# Patient Record
Sex: Male | Born: 1990
Health system: Southern US, Community
[De-identification: ages and names within clinical notes are randomized; demographics above are authoritative.]

---

## 2013-01-11 ENCOUNTER — Emergency Department (INDEPENDENT_AMBULATORY_CARE_PROVIDER_SITE_OTHER)
Admission: EM | Admit: 2013-01-11 | Discharge: 2013-01-11 | Disposition: A | Discharge status: Home / Self Care | Payer: Managed Care, Other (non HMO) | Source: Home / Self Care | Attending: Emergency Medicine | Admitting: Emergency Medicine

## 2013-01-11 ENCOUNTER — Encounter (HOSPITAL_COMMUNITY): Payer: Self-pay | Admitting: Emergency Medicine

## 2013-01-11 DIAGNOSIS — S61219A Laceration without foreign body of unspecified finger without damage to nail, initial encounter: Secondary | ICD-10-CM

## 2013-01-11 DIAGNOSIS — Z23 Encounter for immunization: Secondary | ICD-10-CM

## 2013-01-11 DIAGNOSIS — S61209A Unspecified open wound of unspecified finger without damage to nail, initial encounter: Secondary | ICD-10-CM

## 2013-01-11 MED ORDER — TETANUS-DIPHTH-ACELL PERTUSSIS 5-2.5-18.5 LF-MCG/0.5 IM SUSP
INTRAMUSCULAR | Status: AC
  Filled 2013-01-11: qty 0.5

## 2013-01-11 MED ORDER — TETANUS-DIPHTH-ACELL PERTUSSIS 5-2.5-18.5 LF-MCG/0.5 IM SUSP
0.5000 mL | Freq: Once | INTRAMUSCULAR | Status: AC
  Administered 2013-01-11: 0.5 mL via INTRAMUSCULAR

## 2013-01-11 NOTE — ED Provider Notes (Signed)
Chief Complaint:   Chief Complaint  Patient presents with  . Laceration    History of Present Illness:   Daniel Greene is a 22 year old male who sustained superficial lacerations to his right index and middle fingers on glass yesterday evening. Bleeding has stopped. He has full range of motion of all his digits. Cannot recall when his last tetanus vaccine was.  Review of Systems:  Other than noted above, the patient denies any of the following symptoms: Systemic:  No fever or chills. Musculoskeletal:  No joint pain or decreased range of motion. Neuro:  No numbness, tingling, or weakness.  PMFSH:  Past medical history, family history, social history, meds, and allergies were reviewed.   Physical Exam:   Vital signs:  BP 130/73  Pulse 74  Temp(Src) 98 F (36.7 C) (Oral)  Resp 14  SpO2 99% Ext:  He has a small flap lacerations involving his right index and middle fingers.  All other joints had a full ROM without pain.  Pulses were full.  Good capillary refill in all digits.  No edema. Neurological:  Alert and oriented.  No muscle weakness.  Sensation was intact to light touch.   Procedure: Verbal informed consent was obtained.  The patient was informed of the risks and benefits of the procedure and understands and accepts.  Identity of the patient was verified verbally and by wristband.   The laceration area described above was prepped with Betadine and anesthetized with 3 mL of 2% Xylocaine without epinephrine.  The wound was then closed as follows:  The flap on the dorsum of the right middle finger was sewed down with 5 6-0 nylon sutures, the index finger laceration was sewed down with 3 6-0 nylon sutures.  There were no immediate complications, and the patient tolerated the procedure well. The laceration was then cleansed, Bacitracin ointment was applied and a clean, dry pressure dressing was put on.   Course in Urgent Care Center:   Was given a Tdap vaccine  Assessment:  The encounter  diagnosis was Laceration of fingers without complication, initial encounter.  Plan:   1.  Meds:  The following meds were prescribed:  There are no discharge medications for this patient.   2.  Patient Education/Counseling:  The patient was given appropriate handouts, self care instructions, and instructed in symptomatic relief. Instructions were given for wound care.    3.  Follow up:  The patient was told to follow up immediately if there is any sign of infection.The patient will return in 14 days for suture removal.     Reuben Likes, MD 01/11/13 2102

## 2013-01-11 NOTE — ED Notes (Signed)
Pt  Sustained  Superficial  Lac  To  r  Pointer  r  Middle  Finger  On  Glass  Last  Pm         Bleeding  Has  Subsided        Incident  Last  Pm   -     denys  Any  Sensation       Of  Any  fb

## 2013-01-23 ENCOUNTER — Encounter (HOSPITAL_COMMUNITY): Payer: Self-pay | Admitting: Emergency Medicine

## 2013-01-23 ENCOUNTER — Emergency Department (INDEPENDENT_AMBULATORY_CARE_PROVIDER_SITE_OTHER)
Admission: EM | Admit: 2013-01-23 | Discharge: 2013-01-23 | Disposition: A | Discharge status: Home / Self Care | Payer: Managed Care, Other (non HMO) | Source: Home / Self Care | Attending: Emergency Medicine | Admitting: Emergency Medicine

## 2013-01-23 DIAGNOSIS — Z4802 Encounter for removal of sutures: Secondary | ICD-10-CM

## 2013-01-23 NOTE — ED Notes (Signed)
Here to get suture removed on right index and middle finger

## 2013-01-23 NOTE — ED Provider Notes (Signed)
Subjective: 22 year old male presents for suture removal. He had sutures placed on his right index and middle fingers approximately 12 days ago. He has no pain, redness, swelling, or discharge from the area.  Objective:  Filed Vitals:   01/23/13 1228  BP: 129/86  Pulse: 90  Temp: 98.6 F (37 C)  Resp: 14  SpO2: 100%   Well-dressed well-nourished, no acute distress. Skin is warm and dry. Alert and oriented. 5 Sutures in place on the distal volar middle finger and 2 sutures on the distal volar index finger.  Assessment: Wound has healed appropriately.  Plan: Sutures removed, keep covered with a Band-Aid until scab has healed over. Followup when necessary.  Graylon Good, PA-C 01/23/13 1309

## 2013-01-23 NOTE — ED Provider Notes (Signed)
Medical screening examination/treatment/procedure(s) were performed by non-physician practitioner and as supervising physician I was immediately available for consultation/collaboration.  Lurine Imel, M.D.  Arbutus Nelligan C Violet Cart, MD 01/23/13 1436 

## 2013-05-03 ENCOUNTER — Emergency Department (INDEPENDENT_AMBULATORY_CARE_PROVIDER_SITE_OTHER)
Admission: EM | Admit: 2013-05-03 | Discharge: 2013-05-03 | Disposition: A | Discharge status: Home / Self Care | Payer: Managed Care, Other (non HMO) | Source: Home / Self Care | Attending: Emergency Medicine | Admitting: Emergency Medicine

## 2013-05-03 ENCOUNTER — Encounter (HOSPITAL_COMMUNITY): Payer: Self-pay | Admitting: Emergency Medicine

## 2013-05-03 DIAGNOSIS — J019 Acute sinusitis, unspecified: Secondary | ICD-10-CM

## 2013-05-03 MED ORDER — AMOXICILLIN-POT CLAVULANATE 875-125 MG PO TABS: 1.0000 | ORAL_TABLET | Freq: Two times a day (BID) | ORAL | Status: AC

## 2013-05-03 NOTE — ED Notes (Signed)
Pt  Reports  Symptoms  Of    Sinus   Congested   Drainage   scrathcy  Throat       Pt  Reports        Symptoms  Not  releived  By otc  meds   -  Pt  Appears          In no  Acute  Distress

## 2013-05-03 NOTE — Discharge Instructions (Signed)
Most upper respiratory infections are caused by viruses and do not require antibiotics.  We try to save the antibiotics for when we really need them to prevent bacteria from developing resistance to them.  Here are a few hints about things that can be done at home to help get over an upper respiratory infection quicker: ° °Get extra sleep and extra fluids.  Get 7 to 9 hours of sleep per night and 6 to 8 glasses of water a day.  Getting extra sleep keeps the immune system from getting run down.  Most people with an upper respiratory infection are a little dehydrated.  The extra fluids also keep the secretions liquified and easier to deal with.  Also, get extra vitamin C.  4000 mg per day is the recommended dose. °For the aches, headache, and fever, acetaminophen or ibuprofen are helpful.  These can be alternated every 4 hours.  People with liver disease should avoid large amounts of acetaminophen, and people with ulcer disease, gastroesophageal reflux, gastritis, congestive heart failure, chronic kidney disease, coronary artery disease and the elderly should avoid ibuprofen. °For nasal congestion try Mucinex-D, or if you're having lots of sneezing or clear nasal drainage use Zyrtec-D. People with high blood pressure can take these if their blood pressure is controlled, if not, it's best to avoid the forms with a "D" (decongestants).  You can use the plain Mucinex, Allegra, Claritin, or Zyrtec even if your blood pressure is not controlled.   °A Saline nasal spray such as Ocean Spray can also help.  You can add a decongestant sprays such as Afrin, but you should not use the decongestant sprays for more than 3 or 4 days since they can be habituating.  Breathe Rite nasal strips can also offer a non-drug alternative treatment to nasal congestion, especially at night. °For people with symptoms of sinusitis, sleeping with your head elevated can be helpful.  For sinus pain, moist, hot compresses to the face may provide some  relief.  Many people find that inhaling steam as in a shower or from a pot of steaming water can help. °For any viral infection, zinc containing lozenges such as Cold-Eze or Zicam are helpful.  Zinc helps to fight viral infection.  Hot salt water gargles (8 oz of hot water, 1/2 tsp of table salt, and a pinch of baking soda) can give relief as well as hot beverages such as hot tea.  Sucrets extra strength lozenges will help the sore throat.  °For the cough, take Delsym 2 tsp every 12 hours.  It has also been found recently that Aleve can help control a cough.  The dose is 1 to 2 tablets twice daily with food.  This can be combined with Delsym. (Note, if you are taking ibuprofen, you should not take Aleve as well--take one or the other.) °A cool mist vaporizer will help keep your mucous membranes from drying out.  ° °It's important when you have an upper respiratory infection not to pass the infection to others.  This involves being very careful about the following: ° °Frequent hand washing or use of hand sanitizer, especially after coughing, sneezing, blowing your nose or touching your face, nose or eyes. °Do not shake hands or touch anyone and try to avoid touching surfaces that other people use such as doorknobs, shopping carts, telephones and computer keyboards. °Use tissues and dispose of them properly in a garbage can or ziplock bag. °Cough into your sleeve. °Do not let others eat or   drink after you. ° °It's also important to recognize the signs of serious illness and get evaluated if they occur: °Any respiratory infection that lasts more than 7 to 10 days.  Yellow nasal drainage and sputum are not reliable indicators of a bacterial infection, but if they last for more than 1 week, see your doctor. °Fever and sore throat can indicate strep. °Fever and cough can indicate influenza or pneumonia. °Any kind of severe symptom such as difficulty breathing, intractable vomiting, or severe pain should prompt you to see  a doctor as soon as possible. ° ° °Your body's immune system is really the thing that will get rid of this infection.  Your immune system is comprised of 2 types of specialized cells called T cells and B cells.  T cells coordinate the array of cells in your body that engulf invading bacteria or viruses while B cells orchestrate the production of antibodies that neutralize infection.  Anything we do or any medications we give you, will just strengthen your immune system or help it clear up the infection quicker.  Here are a few helpful hints to improve your immune system to help overcome this illness or to prevent future infections: °· A few vitamins can improve the health of your immune system.  That's why your diet should include plenty of fruits, vegetables, fish, nuts, and whole grains. °· Vitamin A and bet-carotene can increase the cells that fight infections (T cells and B cells).  Vitamin A is abundant in dark greens and orange vegetables such as spinach, greens, sweet potatoes, and carrots. °· Vitamin B6 contributes to the maturation of white blood cells, the cells that fight disease.  Foods with vitamin B6 include cold cereal and bananas. °· Vitamin C is credited with preventing colds because it increases white blood cells and also prevents cellular damage.  Citrus fruits, peaches and green and red bell peppers are all hight in vitamin C. °· Vitamin E is an anti-oxidant that encourages the production of natural killer cells which reject foreign invaders and B cells that produce antibodies.  Foods high in vitamin E include wheat germ, nuts and seeds. °· Foods high in omega-3 fatty acids found in foods like salmon, tuna and mackerel boost your immune system and help cells to engulf and absorb germs. °· Probiotics are good bacteria that increase your T cells.  These can be found in yogurt and are available in supplements such as Culturelle or Align. °· Moderate exercise increases the strength of your immune  system and your ability to recover from illness.  I suggest 3 to 5 moderate intensity 30 minute workouts per week.   °· Sleep is another component of maintaining a strong immune system.  It enables your body to recuperate from the day's activities, stress and work.  My recommendation is to get between 7 and 9 hours of sleep per night. °· If you smoke, try to quit completely or at least cut down.  Drink alcohol only in moderation if at all.  No more than 2 drinks daily for men or 1 for women. °· Get a flu vaccine early in the fall or if you have not gotten one yet, once this illness has run its course.  If you are over 65, a smoker, or an asthmatic, get a pneumococcal vaccine. °· My final recommendation is to maintain a healthy weight.  Excess weight can impair the immune system by interfering with the way the immune system deals with invading viruses or   bacteria.    Sinusitis Sinusitis is redness, soreness, and swelling (inflammation) of the paranasal sinuses. Paranasal sinuses are air pockets within the bones of your face (beneath the eyes, the middle of the forehead, or above the eyes). In healthy paranasal sinuses, mucus is able to drain out, and air is able to circulate through them by way of your nose. However, when your paranasal sinuses are inflamed, mucus and air can become trapped. This can allow bacteria and other germs to grow and cause infection. Sinusitis can develop quickly and last only a short time (acute) or continue over a long period (chronic). Sinusitis that lasts for more than 12 weeks is considered chronic.  CAUSES  Causes of sinusitis include:  Allergies.  Structural abnormalities, such as displacement of the cartilage that separates your nostrils (deviated septum), which can decrease the air flow through your nose and sinuses and affect sinus drainage.  Functional abnormalities, such as when the small hairs (cilia) that line your sinuses and help remove mucus do not work  properly or are not present. SYMPTOMS  Symptoms of acute and chronic sinusitis are the same. The primary symptoms are pain and pressure around the affected sinuses. Other symptoms include:  Upper toothache.  Earache.  Headache.  Bad breath.  Decreased sense of smell and taste.  A cough, which worsens when you are lying flat.  Fatigue.  Fever.  Thick drainage from your nose, which often is green and may contain pus (purulent).  Swelling and warmth over the affected sinuses. DIAGNOSIS  Your caregiver will perform a physical exam. During the exam, your caregiver may:  Look in your nose for signs of abnormal growths in your nostrils (nasal polyps).  Tap over the affected sinus to check for signs of infection.  View the inside of your sinuses (endoscopy) with a special imaging device with a light attached (endoscope), which is inserted into your sinuses. If your caregiver suspects that you have chronic sinusitis, one or more of the following tests may be recommended:  Allergy tests.  Nasal culture A sample of mucus is taken from your nose and sent to a lab and screened for bacteria.  Nasal cytology A sample of mucus is taken from your nose and examined by your caregiver to determine if your sinusitis is related to an allergy. TREATMENT  Most cases of acute sinusitis are related to a viral infection and will resolve on their own within 10 days. Sometimes medicines are prescribed to help relieve symptoms (pain medicine, decongestants, nasal steroid sprays, or saline sprays).  However, for sinusitis related to a bacterial infection, your caregiver will prescribe antibiotic medicines. These are medicines that will help kill the bacteria causing the infection.  Rarely, sinusitis is caused by a fungal infection. In theses cases, your caregiver will prescribe antifungal medicine. For some cases of chronic sinusitis, surgery is needed. Generally, these are cases in which sinusitis recurs  more than 3 times per year, despite other treatments. HOME CARE INSTRUCTIONS   Drink plenty of water. Water helps thin the mucus so your sinuses can drain more easily.  Use a humidifier.  Inhale steam 3 to 4 times a day (for example, sit in the bathroom with the shower running).  Apply a warm, moist washcloth to your face 3 to 4 times a day, or as directed by your caregiver.  Use saline nasal sprays to help moisten and clean your sinuses.  Take over-the-counter or prescription medicines for pain, discomfort, or fever only as directed by  your caregiver. °SEEK IMMEDIATE MEDICAL CARE IF: °· You have increasing pain or severe headaches. °· You have nausea, vomiting, or drowsiness. °· You have swelling around your face. °· You have vision problems. °· You have a stiff neck. °· You have difficulty breathing. °MAKE SURE YOU:  °· Understand these instructions. °· Will watch your condition. °· Will get help right away if you are not doing well or get worse. °Document Released: 02/27/2005 Document Revised: 05/22/2011 Document Reviewed: 03/14/2011 °[employer]® Patient Information ©2014 [employer]. ° °

## 2013-05-03 NOTE — ED Provider Notes (Signed)
  Chief Complaint   Chief Complaint  Patient presents with  . URI    History of Present Illness   Daniel Greene is a 23 year old male who's had a two-week history of a scratchy throat, sore throat, nasal congestion, rhinorrhea with clear to yellow drainage, sinus pressure, ear congestion, fatigue, and a slight nonproductive cough. He denies any specific sick exposures. No fever or chills.  Review of Systems   Other than as noted above, the patient denies any of the following symptoms: Systemic:  No fevers, chills, sweats, or myalgias. Eye:  No redness or discharge. ENT:  No ear pain, headache, nasal congestion, drainage, sinus pressure, or sore throat. Neck:  No neck pain, stiffness, or swollen glands. Lungs:  No cough, sputum production, hemoptysis, wheezing, chest tightness, shortness of breath or chest pain. GI:  No abdominal pain, nausea, vomiting or diarrhea.  PMFSH   Past medical history, family history, social history, meds, and allergies were reviewed.   Physical exam   Vital signs:  BP 115/57  Pulse 85  Temp(Src) 98.5 F (36.9 C) (Oral)  Resp 18  SpO2 100% General:  Alert and oriented.  In no distress.  Skin warm and dry. Eye:  No conjunctival injection or drainage. Lids were normal. ENT:  TMs and canals were normal, without erythema or inflammation.  Nasal mucosa was clear and uncongested, without drainage.  Mucous membranes were moist.  Pharynx was clear with no exudate or drainage.  There were no oral ulcerations or lesions. Neck:  Supple, no adenopathy, tenderness or mass. Lungs:  No respiratory distress.  He has inspiratory wheezes at the left base, no rales or rhonchi.  Heart:  Regular rhythm, without gallops, murmers or rubs. Skin:  Clear, warm, and dry, without rash or lesions.  Assessment     The encounter diagnosis was Acute sinusitis.  Plan    1.  Meds:  The following meds were prescribed:   New Prescriptions   AMOXICILLIN-CLAVULANATE (AUGMENTIN)  875-125 MG PER TABLET    Take 1 tablet by mouth 2 (two) times daily.    2.  Patient Education/Counseling:  The patient was given appropriate handouts, self care instructions, and instructed in symptomatic relief.  Instructed to get extra fluids, rest, and use a cool mist vaporizer.  Suggested decongestants, steam, and sleeping with head elevated.  3.  Follow up:  The patient was told to follow up here if no better in 3 to 4 days, or sooner if becoming worse in any way, and given some red flag symptoms such as increasing fever, difficulty breathing, chest pain, or persistent vomiting which would prompt immediate return.  Follow up here as needed.      Reuben Likesavid C Rossanna Spitzley, MD 05/03/13 [phone]

## 2014-04-19 ENCOUNTER — Encounter (HOSPITAL_COMMUNITY): Payer: Self-pay | Admitting: Emergency Medicine

## 2014-04-19 ENCOUNTER — Emergency Department (INDEPENDENT_AMBULATORY_CARE_PROVIDER_SITE_OTHER): Payer: Managed Care, Other (non HMO)

## 2014-04-19 ENCOUNTER — Emergency Department (HOSPITAL_COMMUNITY)
Admission: EM | Admit: 2014-04-19 | Discharge: 2014-04-19 | Disposition: A | Discharge status: Home / Self Care | Payer: Managed Care, Other (non HMO) | Source: Home / Self Care | Attending: Emergency Medicine | Admitting: Emergency Medicine

## 2014-04-19 DIAGNOSIS — J189 Pneumonia, unspecified organism: Secondary | ICD-10-CM

## 2014-04-19 MED ORDER — GUAIFENESIN-CODEINE 100-10 MG/5ML PO SOLN: 5.0000 mL | ORAL | Status: AC | PRN

## 2014-04-19 MED ORDER — DOXYCYCLINE HYCLATE 100 MG PO CAPS: 100.0000 mg | ORAL_CAPSULE | Freq: Two times a day (BID) | ORAL | Status: AC

## 2014-04-19 NOTE — ED Provider Notes (Signed)
CSN: [id number]     Arrival date & time 04/19/14  1252 History   First MD Initiated Contact with Patient 04/19/14 1358     Chief Complaint  Patient presents with  . Sore Throat  . URI   (Consider location/radiation/quality/duration/timing/severity/associated sxs/prior Treatment) HPI        24 year old male presents complaining of being sick for 4 days. He has cough, congestion, sore throat, chills. Symptoms have been gradually worsening despite over-the-counter medications. He has no measured fever, chest pain, shortness of breath, sinus pain, sinus pressure. No NVD or abdominal pain. No Recent travel or sick contacts.  History reviewed. No pertinent past medical history. History reviewed. No pertinent past surgical history. No family history on file. History  Substance Use Topics  . Smoking status: Never Smoker   . Smokeless tobacco: Not on file  . Alcohol Use: Yes    Review of Systems  Constitutional: Positive for chills and fatigue. Negative for fever.  HENT: Positive for congestion, postnasal drip, rhinorrhea and sore throat. Negative for ear pain and sinus pressure.   Respiratory: Positive for cough. Negative for shortness of breath.   Cardiovascular: Negative for chest pain.  Gastrointestinal: Negative for nausea, vomiting, abdominal pain and diarrhea.  All other systems reviewed and are negative.   Allergies  Review of patient's allergies indicates no known allergies.  Home Medications   Prior to Admission medications   Medication Sig Start Date End Date Taking? Authorizing Provider  ibuprofen (ADVIL,MOTRIN) 400 MG tablet Take 400 mg by mouth every 6 (six) hours as needed.   Yes Historical Provider, MD  Multiple Vitamins-Minerals (EMERGEN-C VITAMIN C PO) Take by mouth.   Yes Historical Provider, MD  Pseudoeph-Doxylamine-DM-APAP (NYQUIL PO) Take by mouth.   Yes Historical Provider, MD  amoxicillin-clavulanate (AUGMENTIN) 875-125 MG per tablet Take 1 tablet by mouth 2  (two) times daily. 05/03/13   Reuben Likesavid C Keller, MD  doxycycline (VIBRAMYCIN) 100 MG capsule Take 1 capsule (100 mg total) by mouth 2 (two) times daily. 04/19/14   Graylon GoodZachary H Nicky Kras, PA-C  guaiFENesin-codeine 100-10 MG/5ML syrup Take 5 mLs by mouth every 4 (four) hours as needed for cough. 04/19/14   Adrian BlackwaterZachary H Loralei Radcliffe, PA-C   BP 130/87 mmHg  Pulse 109  Temp(Src) 98.9 F (37.2 C) (Oral)  Resp 18  SpO2 100% Physical Exam  Constitutional: He is oriented to person, place, and time. He appears well-developed and well-nourished. No distress.  HENT:  Head: Normocephalic and atraumatic.  Right Ear: External ear normal.  Left Ear: External ear normal.  Nose: Nose normal.  Mouth/Throat: Oropharynx is clear and moist. No oropharyngeal exudate.  Eyes: Conjunctivae are normal. Right eye exhibits no discharge. Left eye exhibits no discharge.  Neck: Normal range of motion. Neck supple.  Cardiovascular: Normal rate, regular rhythm and normal heart sounds.   Pulmonary/Chest: Effort normal. No respiratory distress. He has no wheezes. He has rales (right lower lobe).  Neurological: He is alert and oriented to person, place, and time. Coordination normal.  Skin: Skin is warm and dry. No rash noted. He is not diaphoretic.  Psychiatric: He has a normal mood and affect. Judgment normal.  Nursing note and vitals reviewed.   ED Course  Procedures (including critical care time) Labs Review Labs Reviewed - No data to display  Imaging Review No results found.   MDM   1. CAP (community acquired pneumonia)     Community acquired pneumonia, he is in no respiratory distress. He is slightly tachycardic but is  afebrile, blood pressure is normal. Treat with doxycycline, Cheratussin cough syrup. Push fluids, follow-up if no improvement in a few days, ED if worsening   Graylon Good, PA-C 04/19/14 1459

## 2014-04-19 NOTE — Discharge Instructions (Signed)

## 2014-04-19 NOTE — ED Notes (Signed)
Cold and sore throat, onset Wednesday.

## 2015-09-14 IMAGING — DX DG CHEST 2V
2 series · 2 of 2 positions shown · non-contrast
Comparison: None.

CLINICAL DATA: Cough 2-3 days with intermittent fever.

EXAM:
CHEST  2 VIEW

[chest pa]
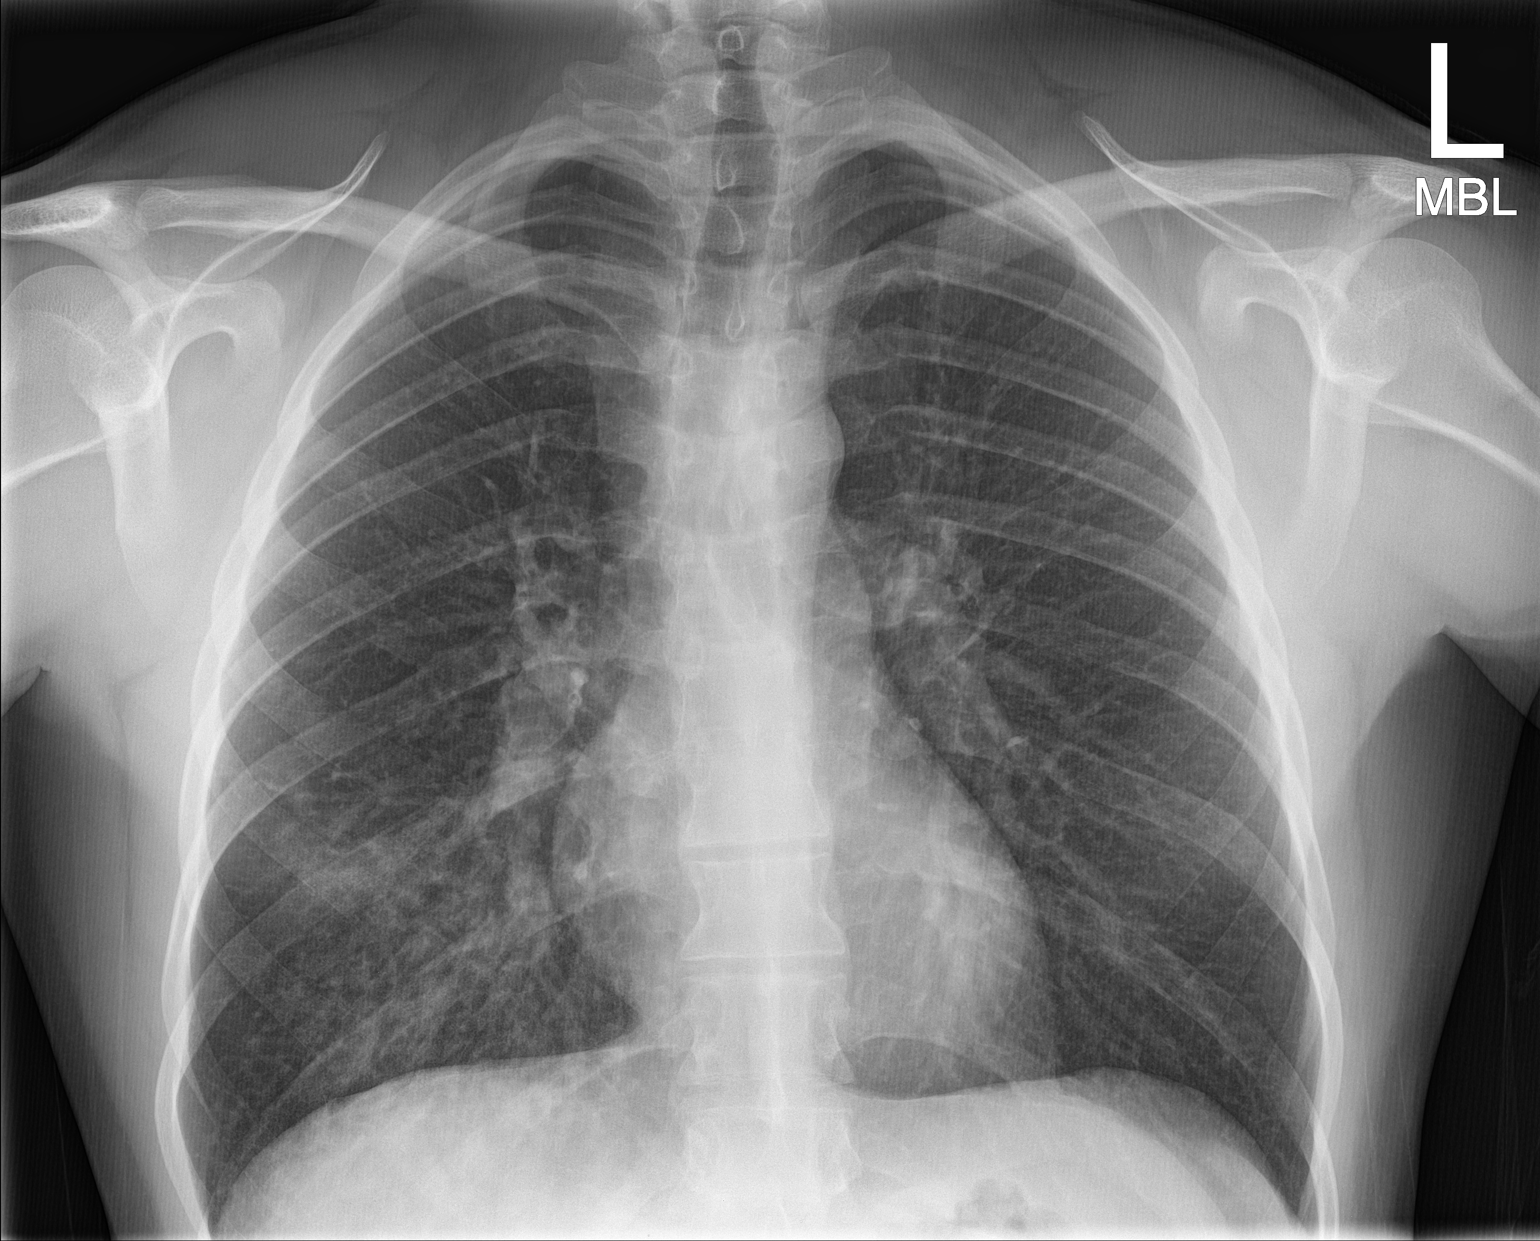

[chest lat]
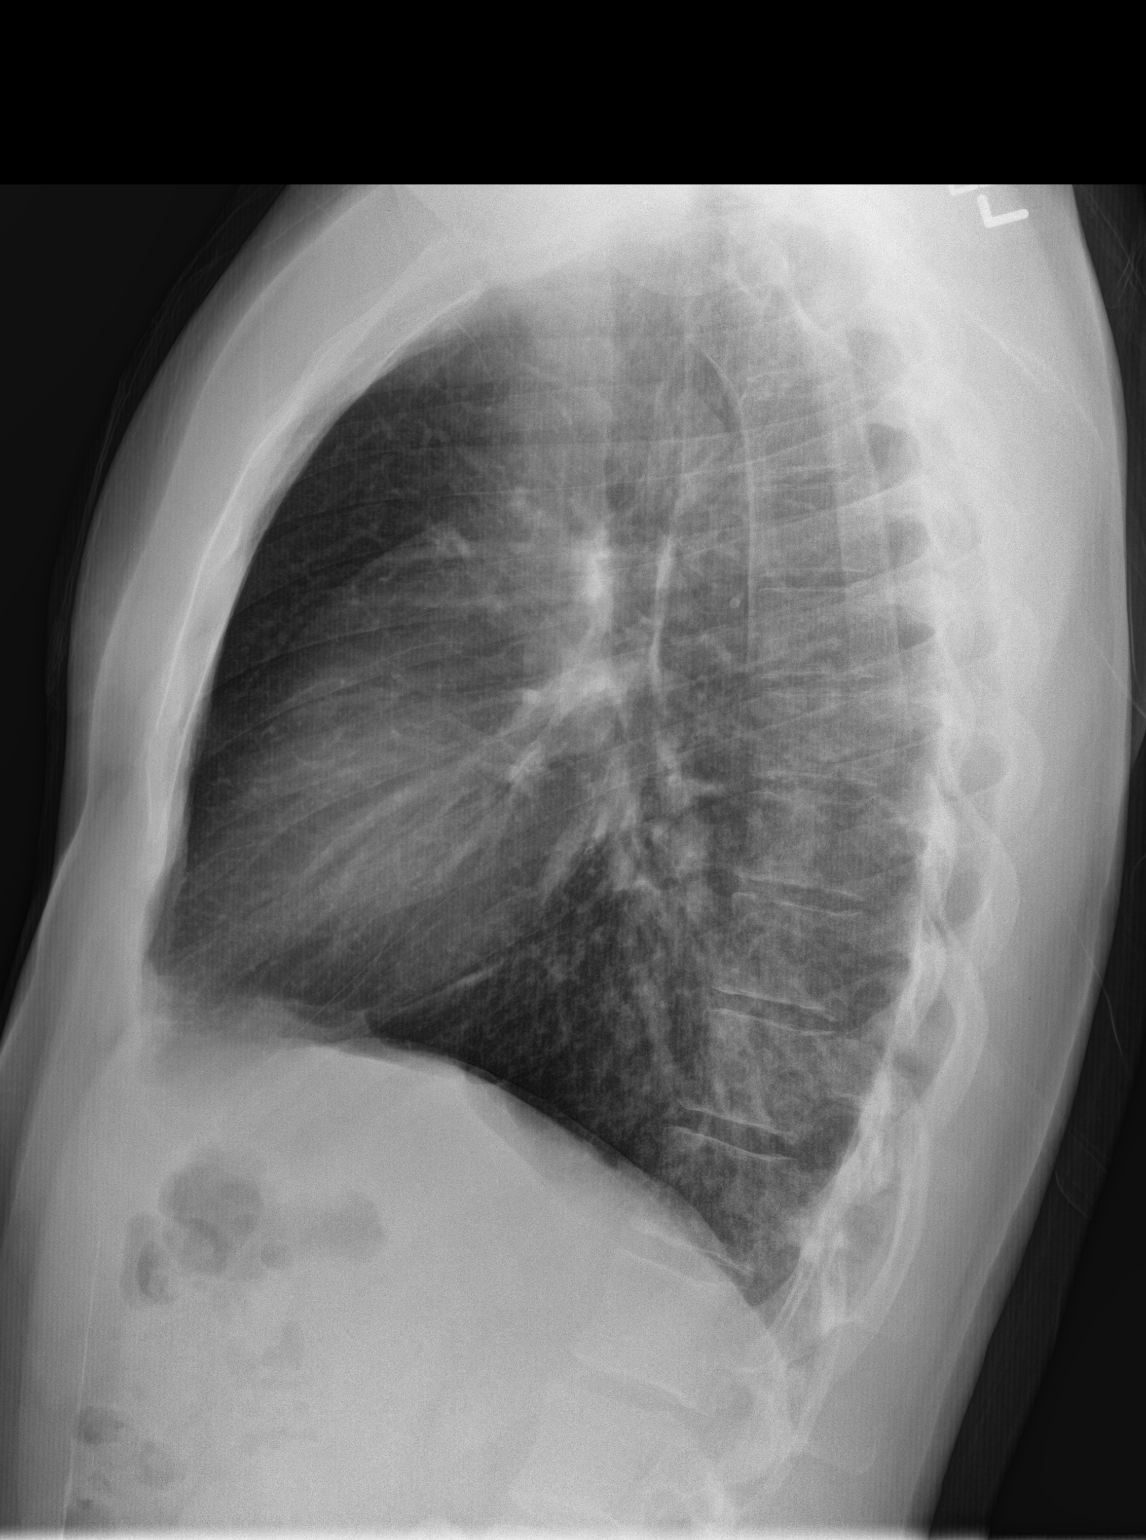

[2 of 2 positions shown; findings below may reference images not displayed]

FINDINGS: Lungs are adequately inflated with heterogeneous airspace
opacification over the posterior right lower lobe. No evidence of
effusion. Cardiomediastinal silhouette and remainder of the exam is
unremarkable.
IMPRESSION: Right lower lobe pneumonia.

## 2017-03-30 DIAGNOSIS — J00 Acute nasopharyngitis [common cold]: Secondary | ICD-10-CM | POA: Diagnosis not present

## 2019-03-19 ENCOUNTER — Ambulatory Visit: Payer: 59 | Attending: Internal Medicine

## 2019-03-19 DIAGNOSIS — Z20822 Contact with and (suspected) exposure to covid-19: Secondary | ICD-10-CM

## 2019-03-20 LAB — NOVEL CORONAVIRUS, NAA: SARS-CoV-2, NAA: NOT DETECTED
